# Patient Record
Sex: Male | Born: 1985 | Hispanic: Yes | Marital: Single | Smoking: Current every day smoker
Health system: Southern US, Community
[De-identification: ages and names within clinical notes are randomized; demographics above are authoritative.]

---

## 2017-01-15 ENCOUNTER — Encounter (HOSPITAL_COMMUNITY): Payer: Self-pay | Admitting: *Deleted

## 2017-01-15 ENCOUNTER — Emergency Department (HOSPITAL_COMMUNITY): Payer: BLUE CROSS/BLUE SHIELD

## 2017-01-15 ENCOUNTER — Emergency Department (HOSPITAL_COMMUNITY)
Admission: EM | Admit: 2017-01-15 | Discharge: 2017-01-15 | Disposition: A | Discharge status: Home / Self Care | Payer: BLUE CROSS/BLUE SHIELD | Attending: Emergency Medicine | Admitting: Emergency Medicine

## 2017-01-15 DIAGNOSIS — R1031 Right lower quadrant pain: Secondary | ICD-10-CM | POA: Diagnosis present

## 2017-01-15 DIAGNOSIS — F17200 Nicotine dependence, unspecified, uncomplicated: Secondary | ICD-10-CM | POA: Insufficient documentation

## 2017-01-15 LAB — CBC
HCT: 51.9 % (ref 39.0–52.0)
Hemoglobin: 18.4 g/dL — ABNORMAL HIGH (ref 13.0–17.0)
MCH: 33.5 pg (ref 26.0–34.0)
MCHC: 35.5 g/dL (ref 30.0–36.0)
MCV: 94.5 fL (ref 78.0–100.0)
PLATELETS: 249 10*3/uL (ref 150–400)
RBC: 5.49 MIL/uL (ref 4.22–5.81)
RDW: 12.1 % (ref 11.5–15.5)
WBC: 13.6 10*3/uL — ABNORMAL HIGH (ref 4.0–10.5)

## 2017-01-15 LAB — URINALYSIS, ROUTINE W REFLEX MICROSCOPIC
Bilirubin Urine: NEGATIVE
GLUCOSE, UA: NEGATIVE mg/dL
HGB URINE DIPSTICK: NEGATIVE
Ketones, ur: 5 mg/dL — AB
Leukocytes, UA: NEGATIVE
Nitrite: NEGATIVE
PROTEIN: NEGATIVE mg/dL
Specific Gravity, Urine: 1.042 — ABNORMAL HIGH (ref 1.005–1.030)
pH: 5 (ref 5.0–8.0)

## 2017-01-15 LAB — COMPREHENSIVE METABOLIC PANEL
ALBUMIN: 4.9 g/dL (ref 3.5–5.0)
ALT: 20 U/L (ref 17–63)
AST: 23 U/L (ref 15–41)
Alkaline Phosphatase: 76 U/L (ref 38–126)
Anion gap: 9 (ref 5–15)
BUN: 12 mg/dL (ref 6–20)
CHLORIDE: 104 mmol/L (ref 101–111)
CO2: 26 mmol/L (ref 22–32)
CREATININE: 0.9 mg/dL (ref 0.61–1.24)
Calcium: 9.7 mg/dL (ref 8.9–10.3)
GFR calc non Af Amer: >60 mL/min (ref >60)
GLUCOSE: 116 mg/dL — AB (ref 65–99)
Potassium: 4 mmol/L (ref 3.5–5.1)
SODIUM: 139 mmol/L (ref 135–145)
Total Bilirubin: 2.2 mg/dL — ABNORMAL HIGH (ref 0.3–1.2)
Total Protein: 7.4 g/dL (ref 6.5–8.1)

## 2017-01-15 LAB — LIPASE, BLOOD: LIPASE: 19 U/L (ref 11–51)

## 2017-01-15 MED ORDER — ONDANSETRON HCL 4 MG PO TABS: 4.0000 mg | ORAL_TABLET | Freq: Three times a day (TID) | ORAL | 0 refills | Status: AC | PRN

## 2017-01-15 MED ORDER — SODIUM CHLORIDE 0.9 % IV SOLN
Freq: Once | INTRAVENOUS | Status: AC
  Administered 2017-01-15: 999 mL/h via INTRAVENOUS

## 2017-01-15 MED ORDER — ONDANSETRON 4 MG PO TBDP
ORAL_TABLET | ORAL | Status: AC
  Administered 2017-01-15: 4 mg
  Filled 2017-01-15: qty 1

## 2017-01-15 MED ORDER — OXYCODONE-ACETAMINOPHEN 5-325 MG PO TABS: 1.0000 | ORAL_TABLET | ORAL | Status: DC | PRN

## 2017-01-15 MED ORDER — IOPAMIDOL (ISOVUE-300) INJECTION 61%
INTRAVENOUS | Status: AC
  Administered 2017-01-15: 100 mL
  Filled 2017-01-15: qty 100

## 2017-01-15 MED ORDER — OXYCODONE-ACETAMINOPHEN 5-325 MG PO TABS
ORAL_TABLET | ORAL | Status: AC
  Administered 2017-01-15: 1
  Filled 2017-01-15: qty 1

## 2017-01-15 MED ORDER — DICYCLOMINE HCL 20 MG PO TABS: 20.0000 mg | ORAL_TABLET | Freq: Two times a day (BID) | ORAL | 0 refills | Status: AC

## 2017-01-15 MED ORDER — ONDANSETRON 4 MG PO TBDP: 4.0000 mg | ORAL_TABLET | Freq: Once | ORAL | Status: DC

## 2017-01-15 NOTE — ED Notes (Signed)
Patient transported to CT 

## 2017-01-15 NOTE — ED Notes (Signed)
Pt remains lethargic and diaphoretic, though alert.

## 2017-01-15 NOTE — ED Provider Notes (Signed)
[facility]-EMERGENCY DEPT Provider Note   CSN: [id number] Arrival date & time: 01/15/17  1604     History   Chief Complaint Chief Complaint  Patient presents with  . Abdominal Pain    HPI Gerald Brown is a 31 y.o. male who presents with cc of RLQ abdominal pain. There is a language barrier and translation services are utilized. The patient  C/o pain in The right lower quadrant of his abdomen that began around 9:30 this morning. At that time. He states it was mild, but has progressively worsened throughout the day. He denies any urinary symptoms or previous abdominal surgeries. In triage the patient had blood drawn about 3 minutes later became diaphoretic and pale and had a presyncopal event. He is also given pain medication and states that his pain is now mild to moderate. However, earlier, his pain was moderate to severe. He denies flank pain. He endorses nausea without vomiting. He has not had any recent illnesses. He denies any urinary symptoms. He denies testicle pain  HPI  History reviewed. No pertinent past medical history.  There are no active problems to display for this patient.   History reviewed. No pertinent surgical history.     Home Medications    Prior to Admission medications   Not on File    Family History No family history on file.  Social History Social History  Substance Use Topics  . Smoking status: Current Every Day Smoker    Packs/day: 0.25  . Smokeless tobacco: Never Used  . Alcohol use No     Allergies   Patient has no known allergies.   Review of Systems Review of Systems  Ten systems reviewed and are negative for acute change, except as noted in the HPI.   Physical Exam Updated Vital Signs BP 114/66 (BP Location: Left Arm)   Pulse 82   Temp 97.9 F (36.6 C) (Oral)   Resp 18   Ht 5\' 6"  (1.676 m)   Wt 65.8 kg   SpO2 98%   BMI 23.40 kg/m   Physical Exam  Constitutional: He appears well-developed and well-nourished. No  distress.  HENT:  Head: Normocephalic and atraumatic.  Eyes: Conjunctivae are normal. No scleral icterus.  Neck: Normal range of motion. Neck supple.  Cardiovascular: Normal rate, regular rhythm and normal heart sounds.   Pulmonary/Chest: Effort normal and breath sounds normal. No respiratory distress.  Abdominal: Soft. There is tenderness (RLQ). There is no rebound.  Musculoskeletal: He exhibits no edema.  Neurological: He is alert.  Skin: Skin is warm and dry. He is not diaphoretic.  Psychiatric: His behavior is normal.  Nursing note and vitals reviewed.    ED Treatments / Results  Labs (all labs ordered are listed, but only abnormal results are displayed) Labs Reviewed  CBC - Abnormal; Notable for the following:       Result Value   WBC 13.6 (*)    Hemoglobin 18.4 (*)    All other components within normal limits  LIPASE, BLOOD  COMPREHENSIVE METABOLIC PANEL  URINALYSIS, ROUTINE W REFLEX MICROSCOPIC    EKG  EKG Interpretation None       Radiology No results found.  Procedures Procedures (including critical care time)  Medications Ordered in ED Medications  ondansetron (ZOFRAN-ODT) disintegrating tablet 4 mg (not administered)  oxyCODONE-acetaminophen (PERCOCET/ROXICET) 5-325 MG per tablet 1 tablet (not administered)  ondansetron (ZOFRAN-ODT) 4 MG disintegrating tablet (4 mg  Given 01/15/17 1621)  oxyCODONE-acetaminophen (PERCOCET/ROXICET) 5-325 MG per tablet (1 tablet  Given 01/15/17 1620)  0.9 %  sodium chloride infusion (999 mL/hr Intravenous New Bag/Given 01/15/17 1648)     Initial Impression / Assessment and Plan / ED Course  I have reviewed the triage vital signs and the nursing notes.  Pertinent labs & imaging results that were available during my care of the patient were reviewed by me and considered in my medical decision making (see chart for details).  Clinical Course as of Jan 15 1841  Fri Jan 15, 2017  1749 Elevated white count  WBC: (!) 13.6 [AH]      Clinical Course User Index [AH] Arthor Captain, PA-C   Patient pain improved. I feel the patient had a vagal event in triage. EKG shows early repol. Patient is nontoxic, nonseptic appearing, in no apparent distress.  Patient's pain and other symptoms adequately managed in emergency department.  Fluid bolus given.  Labs, imaging and vitals reviewed.  Patient does not meet the SIRS or Sepsis criteria.  On repeat exam patient does not have a surgical abdomin and there are no peritoneal signs.  No indication of appendicitis, bowel obstruction, bowel perforation, cholecystitis, diverticulitis, PID or ectopic pregnancy.  Patient discharged home with symptomatic treatment and given strict instructions for follow-up with their primary care physician.  I have also discussed reasons to return immediately to the ER.  Patient expresses understanding and agrees with plan.   .   Final Clinical Impressions(s) / ED Diagnoses   Final diagnoses:  Right lower quadrant abdominal pain    New Prescriptions New Prescriptions   DICYCLOMINE (BENTYL) 20 MG TABLET    Take 1 tablet (20 mg total) by mouth 2 (two) times daily.   ONDANSETRON (ZOFRAN) 4 MG TABLET    Take 1 tablet (4 mg total) by mouth every 8 (eight) hours as needed for nausea or vomiting.     Arthor Captain, PA-C 01/15/17 1845    Melene Plan, DO 01/15/17 [place]

## 2017-01-15 NOTE — ED Triage Notes (Signed)
Pt states RLQ pain since this am.  Nausea, but no vomiting, diarrhea x 1, pain increases with standing.  Denies urinary problems.

## 2017-01-15 NOTE — ED Notes (Signed)
Pt given urinal and informed of need for sample. Pt instructed to notify this RN when sample is available. Pt unable to void at this time.

## 2017-01-15 NOTE — Discharge Instructions (Signed)

## 2017-01-15 NOTE — ED Notes (Signed)
PT became diaphoretic in triage.  bp 75/48.

## 2018-12-28 ENCOUNTER — Emergency Department (HOSPITAL_COMMUNITY): Payer: BLUE CROSS/BLUE SHIELD

## 2018-12-28 ENCOUNTER — Emergency Department (HOSPITAL_COMMUNITY)
Admission: EM | Admit: 2018-12-28 | Discharge: 2018-12-29 | Disposition: A | Discharge status: Home / Self Care | Payer: BLUE CROSS/BLUE SHIELD | Attending: Emergency Medicine | Admitting: Emergency Medicine

## 2018-12-28 ENCOUNTER — Encounter (HOSPITAL_COMMUNITY): Payer: Self-pay | Admitting: *Deleted

## 2018-12-28 ENCOUNTER — Other Ambulatory Visit: Payer: Self-pay

## 2018-12-28 DIAGNOSIS — R109 Unspecified abdominal pain: Secondary | ICD-10-CM | POA: Diagnosis present

## 2018-12-28 DIAGNOSIS — Z79899 Other long term (current) drug therapy: Secondary | ICD-10-CM | POA: Insufficient documentation

## 2018-12-28 DIAGNOSIS — F17210 Nicotine dependence, cigarettes, uncomplicated: Secondary | ICD-10-CM | POA: Insufficient documentation

## 2018-12-28 LAB — BASIC METABOLIC PANEL
ANION GAP: 13 (ref 5–15)
BUN: 11 mg/dL (ref 6–20)
CALCIUM: 10 mg/dL (ref 8.9–10.3)
CO2: 22 mmol/L (ref 22–32)
Chloride: 104 mmol/L (ref 98–111)
Creatinine, Ser: 0.98 mg/dL (ref 0.61–1.24)
Glucose, Bld: 72 mg/dL (ref 70–99)
Potassium: 3.6 mmol/L (ref 3.5–5.1)
Sodium: 139 mmol/L (ref 135–145)

## 2018-12-28 LAB — HEPATIC FUNCTION PANEL
ALT: 22 U/L (ref 0–44)
AST: 25 U/L (ref 15–41)
Albumin: 4.8 g/dL (ref 3.5–5.0)
Alkaline Phosphatase: 62 U/L (ref 38–126)
BILIRUBIN INDIRECT: 1.9 mg/dL — AB (ref 0.3–0.9)
Bilirubin, Direct: 0.3 mg/dL — ABNORMAL HIGH (ref 0.0–0.2)
TOTAL PROTEIN: 7.8 g/dL (ref 6.5–8.1)
Total Bilirubin: 2.2 mg/dL — ABNORMAL HIGH (ref 0.3–1.2)

## 2018-12-28 LAB — URINALYSIS, ROUTINE W REFLEX MICROSCOPIC
Bilirubin Urine: NEGATIVE
Glucose, UA: NEGATIVE mg/dL
Hgb urine dipstick: NEGATIVE
KETONES UR: 20 mg/dL — AB
LEUKOCYTES UA: NEGATIVE
Nitrite: NEGATIVE
PH: 6 (ref 5.0–8.0)
PROTEIN: NEGATIVE mg/dL
Specific Gravity, Urine: 1.023 (ref 1.005–1.030)

## 2018-12-28 LAB — CBC
HCT: 52.2 % — ABNORMAL HIGH (ref 39.0–52.0)
Hemoglobin: 18 g/dL — ABNORMAL HIGH (ref 13.0–17.0)
MCH: 32.8 pg (ref 26.0–34.0)
MCHC: 34.5 g/dL (ref 30.0–36.0)
MCV: 95.1 fL (ref 80.0–100.0)
NRBC: 0 % (ref 0.0–0.2)
PLATELETS: 323 10*3/uL (ref 150–400)
RBC: 5.49 MIL/uL (ref 4.22–5.81)
RDW: 11.8 % (ref 11.5–15.5)
WBC: 9 10*3/uL (ref 4.0–10.5)

## 2018-12-28 LAB — LIPASE, BLOOD: Lipase: 40 U/L (ref 11–51)

## 2018-12-28 MED ORDER — ONDANSETRON HCL 4 MG/2ML IJ SOLN
4.0000 mg | Freq: Once | INTRAMUSCULAR | Status: AC
  Administered 2018-12-28: 4 mg via INTRAVENOUS
  Filled 2018-12-28: qty 2

## 2018-12-28 MED ORDER — HYDROMORPHONE HCL 1 MG/ML IJ SOLN
1.0000 mg | Freq: Once | INTRAMUSCULAR | Status: AC
  Administered 2018-12-28: 1 mg via INTRAVENOUS
  Filled 2018-12-28: qty 1

## 2018-12-28 MED ORDER — FENTANYL CITRATE (PF) 100 MCG/2ML IJ SOLN
50.0000 ug | INTRAMUSCULAR | Status: DC | PRN
  Filled 2018-12-28: qty 2

## 2018-12-28 MED ORDER — IOPAMIDOL (ISOVUE-370) INJECTION 76%
INTRAVENOUS | Status: AC
  Filled 2018-12-28: qty 100

## 2018-12-28 MED ORDER — IOPAMIDOL (ISOVUE-370) INJECTION 76%
100.0000 mL | Freq: Once | INTRAVENOUS | Status: AC | PRN
  Administered 2018-12-28: 100 mL via INTRAVENOUS

## 2018-12-28 MED ORDER — SODIUM CHLORIDE 0.9 % IV BOLUS
1000.0000 mL | Freq: Once | INTRAVENOUS | Status: AC
  Administered 2018-12-28: 1000 mL via INTRAVENOUS

## 2018-12-28 NOTE — ED Provider Notes (Signed)
[facility] EMERGENCY DEPARTMENT Provider Note   CSN: [id number] Arrival date & time: 12/28/18  2025     History   Chief Complaint Chief Complaint  Patient presents with  . Flank Pain    HPI Gerald Brown is a 33 y.o. male.  The history is provided by the patient. The history is limited by a language barrier. A language interpreter was used.     33 year old male presenting for evaluation of flank pain.  Patient is Spanish-speaking, history obtained through language interpreter.  Patient report approximately 30 minutes ago, he was leaning over when he developed acute onset of right flank pain.  Pain is sharp, stabbing, very intense, with nausea, diaphoresis.  Pain is nonradiating nothing seems to make it better or worse.  Patient has never had this, pain before.  No prior history of kidney stone.  Denies any recent sickness, no fever chills chest pain shortness of breath dysuria or hematuria.  No specific treatment tried.  History reviewed. No pertinent past medical history.  There are no active problems to display for this patient.   History reviewed. No pertinent surgical history.      Home Medications    Prior to Admission medications   Medication Sig Start Date End Date Taking? Authorizing Provider  dicyclomine (BENTYL) 20 MG tablet Take 1 tablet (20 mg total) by mouth 2 (two) times daily. 01/15/17   Harris, Abigail, PA-C  ondansetron (ZOFRAN) 4 MG tablet Take 1 tablet (4 mg total) by mouth every 8 (eight) hours as needed for nausea or vomiting. 01/15/17   Arthor Captain, PA-C    Family History No family history on file.  Social History Social History   Tobacco Use  . Smoking status: Current Every Day Smoker    Packs/day: 0.25  . Smokeless tobacco: Never Used  Substance Use Topics  . Alcohol use: No  . Drug use: No     Allergies   Patient has no known allergies.   Review of Systems Review of Systems  All other systems reviewed and  are negative.    Physical Exam Updated Vital Signs BP 107/64   Pulse 71   Temp 97.9 F (36.6 C) (Oral)   Resp 17   SpO2 98%   Physical Exam Vitals signs and nursing note reviewed.  Constitutional:      General: He is in acute distress.     Appearance: He is well-developed. He is diaphoretic.     Comments: Patient appears very uncomfortable, diaphoretic, in moderate distress.  HENT:     Head: Atraumatic.  Eyes:     Conjunctiva/sclera: Conjunctivae normal.  Neck:     Musculoskeletal: Neck supple.  Cardiovascular:     Rate and Rhythm: Tachycardia present.  Pulmonary:     Comments: He is tachypneic without any wheezes, rales, rhonchi. Abdominal:     Palpations: Abdomen is soft.     Tenderness: There is no abdominal tenderness. There is right CVA tenderness and left CVA tenderness.  Musculoskeletal:        General: No swelling or tenderness.  Skin:    Findings: No rash.  Neurological:     Mental Status: He is alert and oriented to person, place, and time.      ED Treatments / Results  Labs (all labs ordered are listed, but only abnormal results are displayed) Labs Reviewed  URINALYSIS, ROUTINE W REFLEX MICROSCOPIC - Abnormal; Notable for the following components:      Result Value   Ketones,  ur 20 (*)    All other components within normal limits  CBC - Abnormal; Notable for the following components:   Hemoglobin 18.0 (*)    HCT 52.2 (*)    All other components within normal limits  HEPATIC FUNCTION PANEL - Abnormal; Notable for the following components:   Total Bilirubin 2.2 (*)    Bilirubin, Direct 0.3 (*)    Indirect Bilirubin 1.9 (*)    All other components within normal limits  RAPID URINE DRUG SCREEN, HOSP PERFORMED - Abnormal; Notable for the following components:   Opiates POSITIVE (*)    Tetrahydrocannabinol POSITIVE (*)    All other components within normal limits  BASIC METABOLIC PANEL  LIPASE, BLOOD  I-STAT TROPONIN, ED    EKG EKG  Interpretation  Date/Time:  Wednesday December 28 2018 23:36:01 EST Ventricular Rate:  71 PR Interval:    QRS Duration: 90 QT Interval:  375 QTC Calculation: 408 R Axis:   92 Text Interpretation:  Sinus rhythm Borderline right axis deviation ST elevation suggests acute pericarditis No significant change since last tracing Confirmed by Richardean Canal ([id number]) on 12/28/2018 11:57:36 PM   Radiology Ct Renal Stone Study  Result Date: 12/28/2018 CLINICAL DATA:  33 year old male with sudden and sent right flank pain and nausea. EXAM: CT ABDOMEN AND PELVIS WITHOUT CONTRAST TECHNIQUE: Multidetector CT imaging of the abdomen and pelvis was performed following the standard protocol without IV contrast. COMPARISON:  CT of the abdomen pelvis dated 01/15/2017 FINDINGS: Evaluation of this exam is limited in the absence of intravenous contrast. Evaluation is also limited due to respiratory motion artifact. Lower chest: The visualized lung bases are clear. No intra-abdominal free air or free fluid. Hepatobiliary: No focal liver abnormality is seen. No gallstones, gallbladder wall thickening, or biliary dilatation. Pancreas: Unremarkable. No pancreatic ductal dilatation or surrounding inflammatory changes. Spleen: Normal in size without focal abnormality. Adrenals/Urinary Tract: Adrenal glands are unremarkable. Kidneys are normal, without renal calculi, focal lesion, or hydronephrosis. Bladder is unremarkable. Stomach/Bowel: Stomach is within normal limits. Appendix appears normal. No evidence of bowel wall thickening, distention, or inflammatory changes. Vascular/Lymphatic: The abdominal aorta and IVC are grossly unremarkable on this noncontrast CT. No portal venous gas. There is no adenopathy. Reproductive: The prostate and seminal vesicles are grossly unremarkable. No pelvic mass. Other: None Musculoskeletal: Bilateral L5 pars defects. No listhesis. No acute osseous pathology. IMPRESSION: No acute intra-abdominal or  pelvic pathology. No hydronephrosis or nephrolithiasis. Electronically Signed   By: Elgie Collard M.D.   On: 12/28/2018 22:28   Ct Angio Chest/abd/pel For Dissection W And/or Wo Contrast  Result Date: 12/28/2018 CLINICAL DATA:  33 year old male with sudden onset right flank pain, diaphoresis, and nausea. EXAM: CT ANGIOGRAPHY CHEST, ABDOMEN AND PELVIS TECHNIQUE: Multidetector CT imaging through the chest, abdomen and pelvis was performed using the standard protocol during bolus administration of intravenous contrast. Multiplanar reconstructed images and MIPs were obtained and reviewed to evaluate the vascular anatomy. CONTRAST:  ISOVUE-370 IOPAMIDOL (ISOVUE-370) INJECTION 76% COMPARISON:  None. FINDINGS: CTA CHEST FINDINGS Cardiovascular: There is no cardiomegaly or pericardial effusion. The thoracic aorta is unremarkable. No CT evidence of pulmonary embolism. Mediastinum/Nodes: There is no hilar or mediastinal adenopathy. The esophagus and the thyroid gland are grossly unremarkable. No mediastinal fluid collection. Lungs/Pleura: The lungs are clear. There is no pleural effusion or pneumothorax. The central airways are patent. Musculoskeletal: No acute osseous pathology. Review of the MIP images confirms the above findings. CTA ABDOMEN AND PELVIS FINDINGS VASCULAR Aorta: Normal  caliber aorta without aneurysm, dissection, vasculitis or significant stenosis. Celiac: Patent without evidence of aneurysm, dissection, vasculitis or significant stenosis. SMA: Patent without evidence of aneurysm, dissection, vasculitis or significant stenosis. Renals: Both renal arteries are patent without evidence of aneurysm, dissection, vasculitis, fibromuscular dysplasia or significant stenosis. IMA: Patent without evidence of aneurysm, dissection, vasculitis or significant stenosis. Inflow: Patent without evidence of aneurysm, dissection, vasculitis or significant stenosis. Veins: No obvious venous abnormality within the  limitations of this arterial phase study. Review of the MIP images confirms the above findings. NON-VASCULAR Hepatobiliary: No focal liver abnormality is seen. No gallstones, gallbladder wall thickening, or biliary dilatation. Pancreas: Unremarkable. No pancreatic ductal dilatation or surrounding inflammatory changes. Spleen: Normal in size without focal abnormality. Adrenals/Urinary Tract: Adrenal glands are unremarkable. Kidneys are normal, without renal calculi, focal lesion, or hydronephrosis. Bladder is unremarkable. Stomach/Bowel: Stomach is within normal limits. Appendix appears normal. No evidence of bowel wall thickening, distention, or inflammatory changes. Lymphatic: No adenopathy. Reproductive: The prostate and seminal vesicles are grossly unremarkable. Other: No abdominal wall hernia or abnormality. No abdominopelvic ascites. Musculoskeletal: No acute osseous pathology. 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 Review of the MIP images confirms the above findings. IMPRESSION: No acute intrathoracic, abdominal, or pelvic pathology. No aortic aneurysm or dissection. No CT evidence of pulmonary embolism. Electronically Signed   By: Elgie CollardArash  Radparvar M.D.   On: 12/28/2018 23:45    Procedures Procedures (including critical care time)  Medications Ordered in ED Medications  fentaNYL (SUBLIMAZE) injection 50 mcg (has no administration in time range)  iopamidol (ISOVUE-370) 76 % injection (has no administration in time range)  HYDROmorphone (DILAUDID) injection 1 mg (1 mg Intravenous Given 12/28/18 2107)  ondansetron (ZOFRAN) injection 4 mg (4 mg Intravenous Given 12/28/18 2107)  HYDROmorphone (DILAUDID) injection 1 mg (1 mg Intravenous Given 12/28/18 2329)  sodium chloride 0.9 % bolus 1,000 mL (1,000 mLs Intravenous New Bag/Given 12/28/18 2329)  iopamidol (ISOVUE-370) 76 % injection 100 mL (100 mLs Intravenous Contrast Given 12/28/18 2300)     Initial Impression / Assessment and  Plan / ED Course  I have reviewed the triage vital signs and the nursing notes.  Pertinent labs & imaging results that were available during my care of the patient were reviewed by me and considered in my medical decision making (see chart for details).     BP 107/64   Pulse 71   Temp 97.9 F (36.6 C) (Oral)   Resp 17   SpO2 98%    Final Clinical Impressions(s) / ED Diagnoses   Final diagnoses:  Right flank pain    ED Discharge Orders         Ordered    ibuprofen (ADVIL,MOTRIN) 600 MG tablet  Every 6 hours PRN     12/29/18 0006    cyclobenzaprine (FLEXERIL) 10 MG tablet  2 times daily PRN     12/29/18 0006         9:10 PM Patient here with acute onset of right flank pain concerning for nephrolithiasis.  He is diaphoretic but appears very uncomfortable.  Work-up initiated, will provide pain medication and antinausea medication.  Abdomen and pelvis CT scan ordered.  11:53 PM Urinalysis without any concerning changes, no signs of infection, normal hepatic function panel, normal lipase, normal basic metabolic panel, normal CBC.  Hemoglobin is concentrated at 18.  Initial renal stone study without any acute finding, no evidence of hydronephrosis or nephrolithiasis.  EKG shows diffuse ST elevation suggestive of acute pericarditis.  This is unchanged from prior.  He does not complain of any active chest pain.  Due to concern of potential dissection, a chest abdomen and pelvis CT angiogram dissection study was obtained show no acute finding.  Patient does appear to be more comfortable after receiving second dose of pain medication.  Plan to discharge home with symptomatic treatment and outpatient follow-up.  Strict return precaution discussed.  Please note patient does not complain of any testicular pain or penile discomfort to suggest testicular torsion causing his pain.  Care discussed with DR. Yao.   12:21 AM Pt is ready for discharge.  Troponin have not resulted yet but pt denies  any active CP.  Doubt ACS.    Fayrene Helper, PA-C 12/29/18 0021    Charlynne Pander, MD 12/29/18 [phone]

## 2018-12-28 NOTE — ED Triage Notes (Signed)
Pt reports he had sudden onset right flank pain about 15 minutes PTA.  He is diaphoretic, nauseated and restless in triage. Denies that he has had urinary symptoms.

## 2018-12-29 LAB — RAPID URINE DRUG SCREEN, HOSP PERFORMED
Amphetamines: NOT DETECTED
Barbiturates: NOT DETECTED
Benzodiazepines: NOT DETECTED
Cocaine: NOT DETECTED
OPIATES: POSITIVE — AB
Tetrahydrocannabinol: POSITIVE — AB

## 2018-12-29 LAB — I-STAT TROPONIN, ED: Troponin i, poc: 0 ng/mL (ref 0.00–0.08)

## 2018-12-29 MED ORDER — CYCLOBENZAPRINE HCL 10 MG PO TABS: 10.0000 mg | ORAL_TABLET | Freq: Two times a day (BID) | ORAL | 0 refills | Status: AC | PRN

## 2018-12-29 MED ORDER — IBUPROFEN 600 MG PO TABS: 600.0000 mg | ORAL_TABLET | Freq: Four times a day (QID) | ORAL | 0 refills | Status: AC | PRN

## 2018-12-29 NOTE — Discharge Instructions (Addendum)
You have been evaluated for flank pain.  Your exam and testing today did not show any obvious cause of your pain.  Take ibuprofen and flexeril as needed for pain.  Call and follow up with a primary care provider for further evaluation of your condition.  Return promptly to the ER if your condition worsen or if you have other concerns.

## 2019-01-23 ENCOUNTER — Ambulatory Visit: Payer: BLUE CROSS/BLUE SHIELD | Attending: Family Medicine

## 2020-05-07 IMAGING — CT CT ANGIO CHEST-ABD-PELV FOR DISSECTION W/ AND WO/W CM
2 of 9 series · 14 of 46 positions shown, 16 images · IV contrast (iopamidol)
Comparison: None.

CLINICAL DATA: 32-year-old male with sudden onset right flank pain,
diaphoresis, and nausea.

EXAM:
CT ANGIOGRAPHY CHEST, ABDOMEN AND PELVIS
TECHNIQUE: Multidetector CT imaging through the chest, abdomen and pelvis was
performed using the standard protocol during bolus administration of
intravenous contrast. Multiplanar reconstructed images and MIPs were
obtained and reviewed to evaluate the vascular anatomy.
CONTRAST:  100mL NK18SM-8CL IOPAMIDOL (NK18SM-8CL) INJECTION 76%

[Series 6: thins · axial · 0.75mm/px · z∈[-552,-6]mm · 11 of 608 slices shown, 13 images]
[im 31/608  soft-tissue]
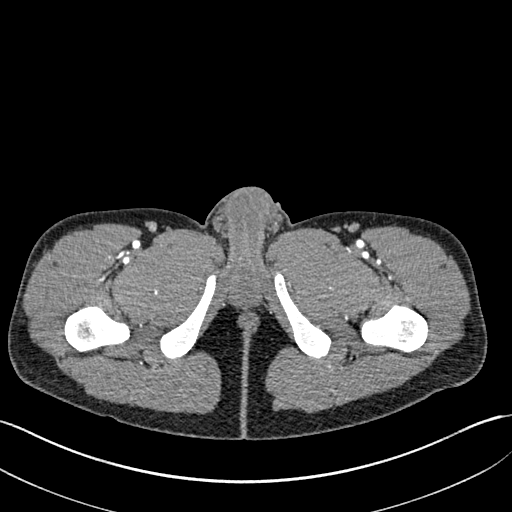
[im 31/608  bone]
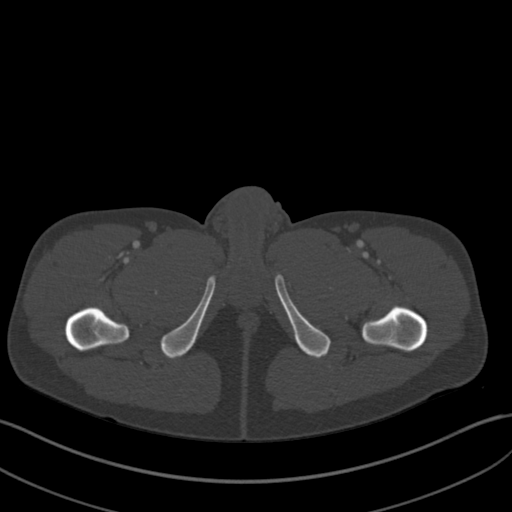
[im 92/608  soft-tissue]
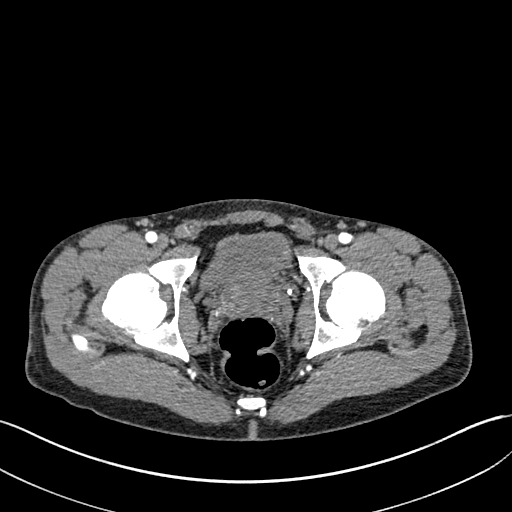
[im 152/608  soft-tissue]
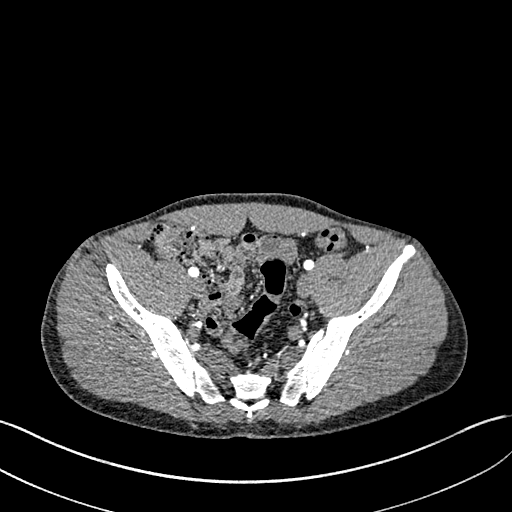
[im 213/608  soft-tissue]
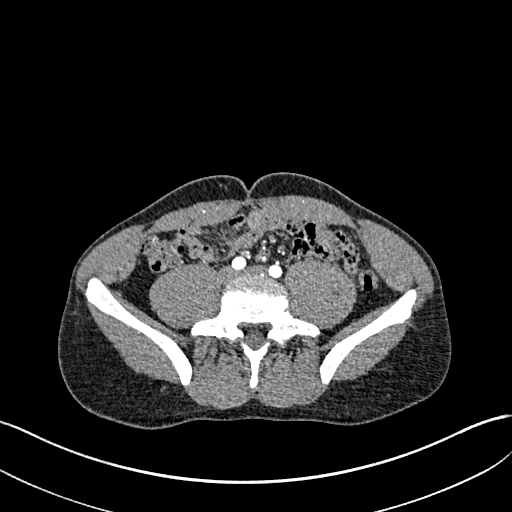
[im 243/608  soft-tissue]
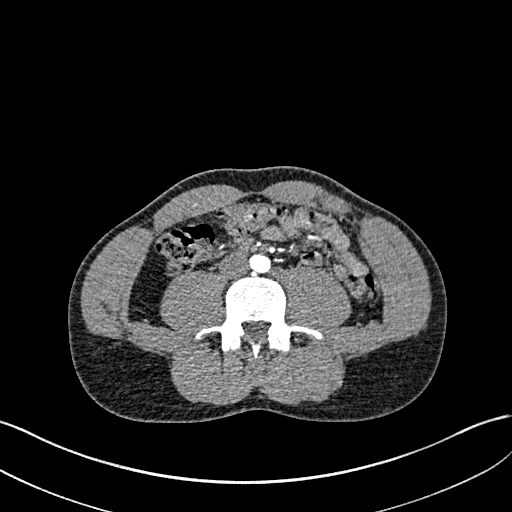
[im 304/608  soft-tissue]
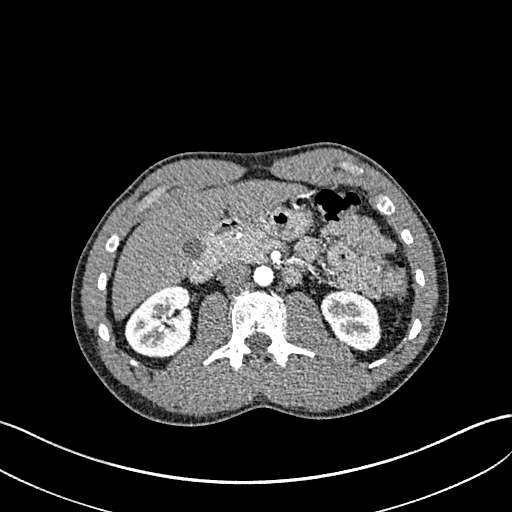
[im 365/608  soft-tissue]
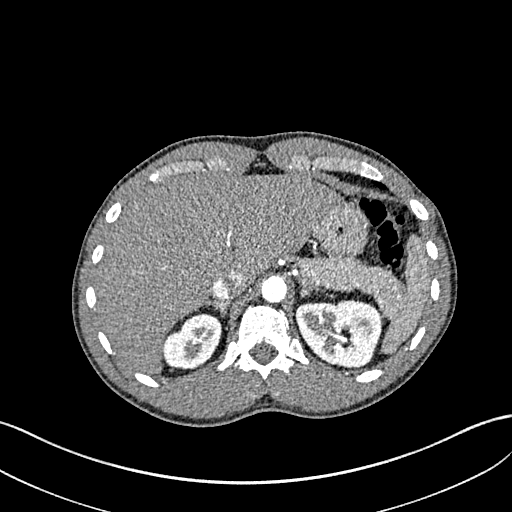
[im 395/608  soft-tissue]
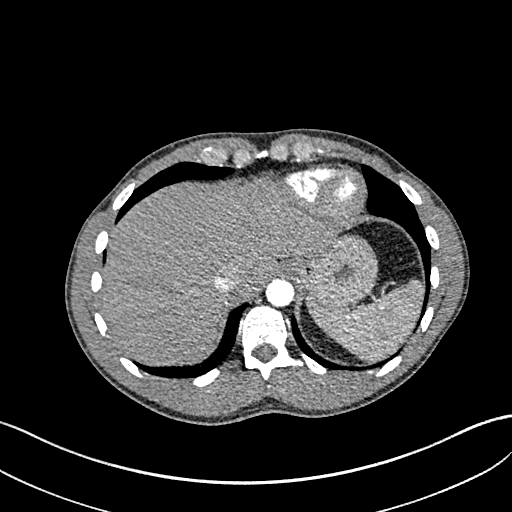
[im 456/608  soft-tissue]
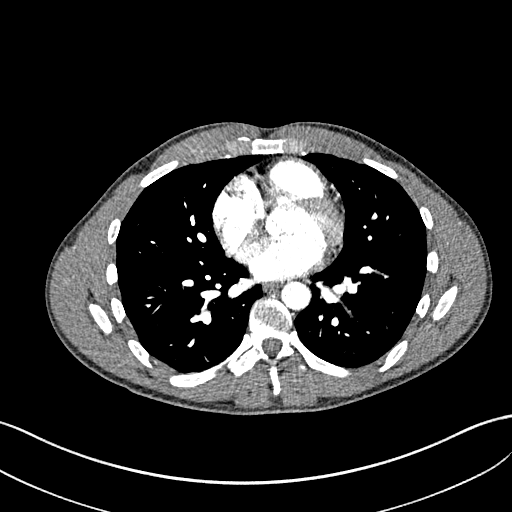
[im 456/608  bone]
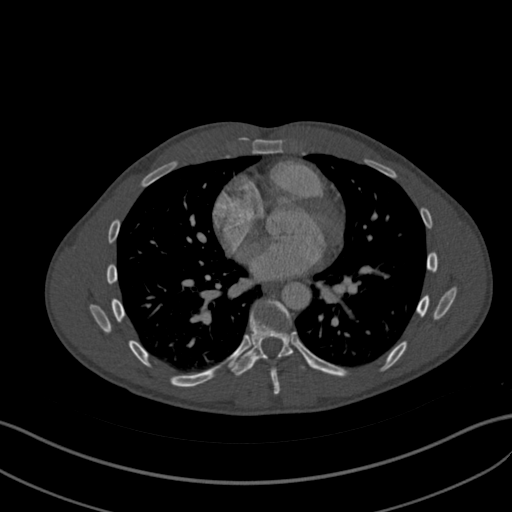
[im 516/608  soft-tissue]
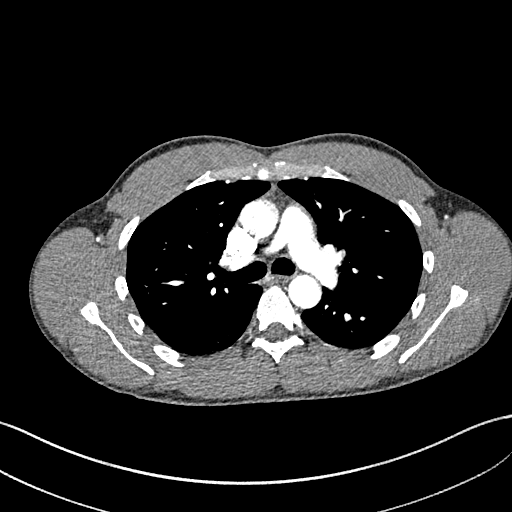
[im 577/608  soft-tissue]
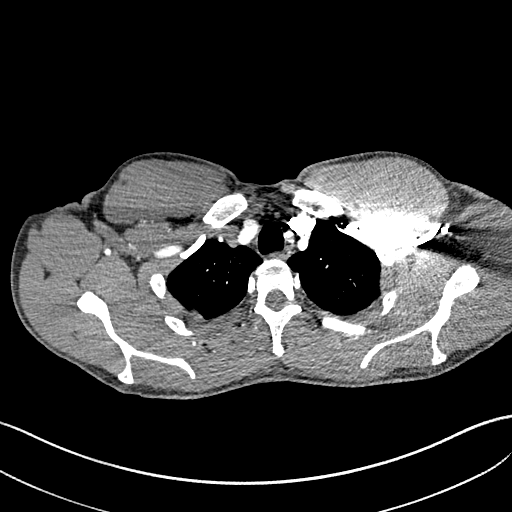

[Series 8: coronal mpr · coronal · 0.71mm/px · 3 of 115 slices shown]
[im 29/115  soft-tissue]
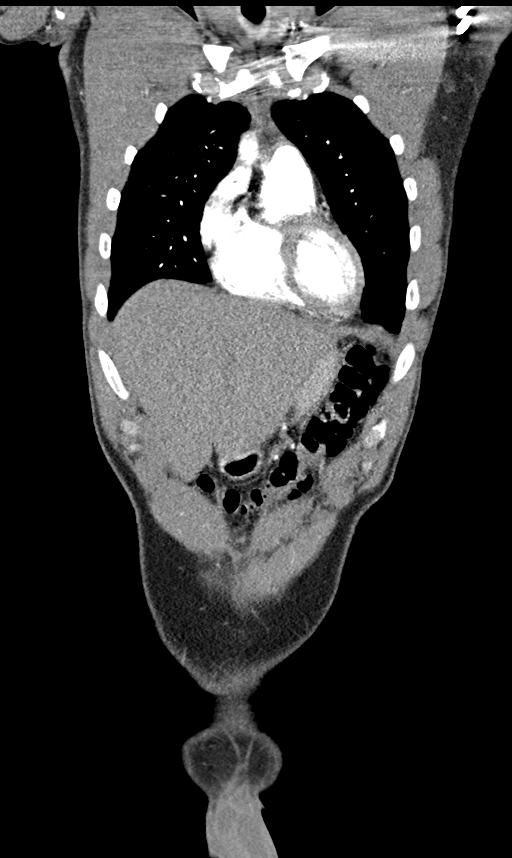
[im 58/115  soft-tissue]
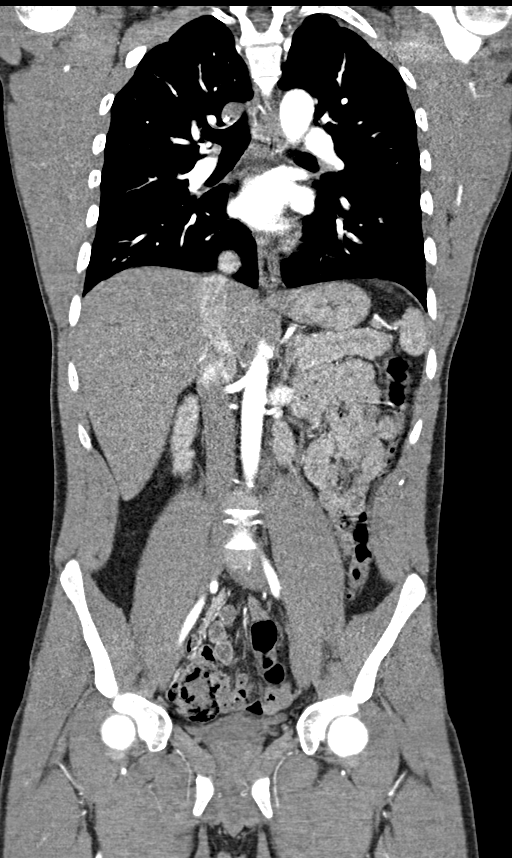
[im 86/115  soft-tissue]
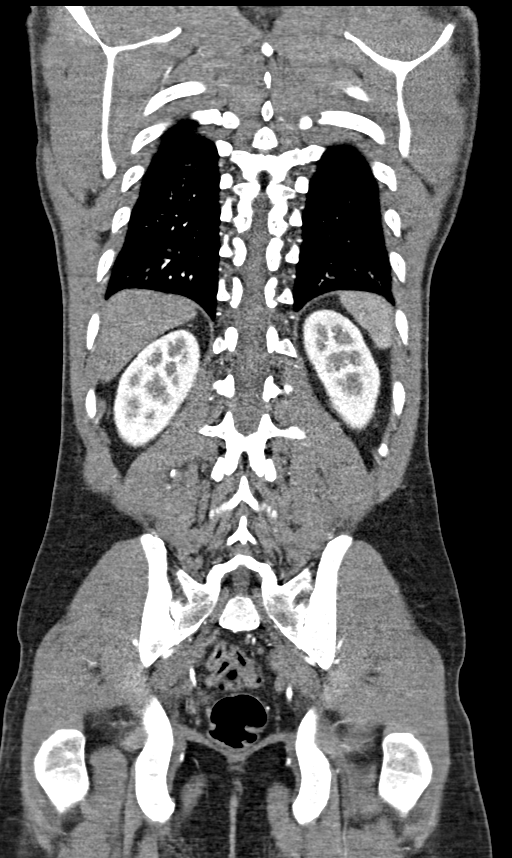

[14 of 46 positions shown; findings below may reference images not displayed]

FINDINGS: CTA CHEST FINDINGS

Cardiovascular: There is no cardiomegaly or pericardial effusion.
The thoracic aorta is unremarkable. No CT evidence of pulmonary
embolism.

Mediastinum/Nodes: There is no hilar or mediastinal adenopathy. The
esophagus and the thyroid gland are grossly unremarkable. No
mediastinal fluid collection.

Lungs/Pleura: The lungs are clear. There is no pleural effusion or
pneumothorax. The central airways are patent.

Musculoskeletal: No acute osseous pathology.

Review of the MIP images confirms the above findings.

CTA ABDOMEN AND PELVIS FINDINGS

VASCULAR

Aorta: Normal caliber aorta without aneurysm, dissection, vasculitis
or significant stenosis.

Celiac: Patent without evidence of aneurysm, dissection, vasculitis
or significant stenosis.

SMA: Patent without evidence of aneurysm, dissection, vasculitis or
significant stenosis.

Renals: Both renal arteries are patent without evidence of aneurysm,
dissection, vasculitis, fibromuscular dysplasia or significant
stenosis.

IMA: Patent without evidence of aneurysm, dissection, vasculitis or
significant stenosis.

Inflow: Patent without evidence of aneurysm, dissection, vasculitis
or significant stenosis.

Veins: No obvious venous abnormality within the limitations of this
arterial phase study.

Review of the MIP images confirms the above findings.

NON-VASCULAR

Hepatobiliary: No focal liver abnormality is seen. No gallstones,
gallbladder wall thickening, or biliary dilatation.

Pancreas: Unremarkable. No pancreatic ductal dilatation or
surrounding inflammatory changes.

Spleen: Normal in size without focal abnormality.

Adrenals/Urinary Tract: Adrenal glands are unremarkable. Kidneys are
normal, without renal calculi, focal lesion, or hydronephrosis.
Bladder is unremarkable.

Stomach/Bowel: Stomach is within normal limits. Appendix appears
normal. No evidence of bowel wall thickening, distention, or
inflammatory changes.

Lymphatic: No adenopathy.

Reproductive: The prostate and seminal vesicles are grossly
unremarkable.

Other: No abdominal wall hernia or abnormality. No abdominopelvic
ascites.

Musculoskeletal: No acute osseous pathology. Bilateral L5 pars
defects. Grade 1 L5-S1 anterolisthesis.

Review of the MIP images confirms the above findings.
IMPRESSION: No acute intrathoracic, abdominal, or pelvic pathology. No aortic
aneurysm or dissection. No CT evidence of pulmonary embolism.
